# Patient Record
Sex: Male | Born: 1995
Health system: Southern US, Community
[De-identification: ages and names within clinical notes are randomized; demographics above are authoritative.]

## PROBLEM LIST (undated history)

## (undated) DIAGNOSIS — J45909 Unspecified asthma, uncomplicated: Secondary | ICD-10-CM

---
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
## 2017-03-22 ENCOUNTER — Emergency Department (HOSPITAL_COMMUNITY)
Admission: EM | Admit: 2017-03-22 | Discharge: 2017-03-23 | Disposition: A | Discharge status: Home / Self Care | Payer: Self-pay | Attending: Emergency Medicine | Admitting: Emergency Medicine

## 2017-03-22 ENCOUNTER — Encounter (HOSPITAL_COMMUNITY): Payer: Self-pay | Admitting: Emergency Medicine

## 2017-03-22 ENCOUNTER — Emergency Department (HOSPITAL_COMMUNITY): Payer: Self-pay

## 2017-03-22 DIAGNOSIS — Z87891 Personal history of nicotine dependence: Secondary | ICD-10-CM | POA: Insufficient documentation

## 2017-03-22 DIAGNOSIS — J45909 Unspecified asthma, uncomplicated: Secondary | ICD-10-CM | POA: Insufficient documentation

## 2017-03-22 DIAGNOSIS — Y999 Unspecified external cause status: Secondary | ICD-10-CM | POA: Insufficient documentation

## 2017-03-22 DIAGNOSIS — M25562 Pain in left knee: Secondary | ICD-10-CM

## 2017-03-22 DIAGNOSIS — Y9289 Other specified places as the place of occurrence of the external cause: Secondary | ICD-10-CM | POA: Insufficient documentation

## 2017-03-22 DIAGNOSIS — Y939 Activity, unspecified: Secondary | ICD-10-CM | POA: Insufficient documentation

## 2017-03-22 DIAGNOSIS — W501XXA Accidental kick by another person, initial encounter: Secondary | ICD-10-CM | POA: Insufficient documentation

## 2017-03-22 DIAGNOSIS — S8992XA Unspecified injury of left lower leg, initial encounter: Secondary | ICD-10-CM | POA: Insufficient documentation

## 2017-03-22 HISTORY — DX: Unspecified asthma, uncomplicated: J45.909

## 2017-03-22 MED ORDER — NAPROXEN 500 MG PO TABS: 500.0000 mg | ORAL_TABLET | Freq: Two times a day (BID) | ORAL | 0 refills | Status: DC
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
## 2017-03-22 NOTE — Discharge Instructions (Signed)
Take the prescribed medication as directed. Follow-up with Dr. Linna CapriceSwinteck in clinic--call his office on Monday morning to make an appt. Return to the ED for new or worsening symptoms.
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
## 2017-03-23 NOTE — ED Notes (Signed)
Pt understood dc material. NAD noted. Script given at dc  

## 2017-03-25 DIAGNOSIS — M238X2 Other internal derangements of left knee: Secondary | ICD-10-CM | POA: Diagnosis not present

## 2017-03-25 DIAGNOSIS — M25562 Pain in left knee: Secondary | ICD-10-CM | POA: Diagnosis not present

## 2017-03-28 ENCOUNTER — Ambulatory Visit (HOSPITAL_COMMUNITY)
Admission: RE | Admit: 2017-03-28 | Discharge: 2017-03-28 | Disposition: A | Discharge status: Home / Self Care | Payer: 59 | Source: Ambulatory Visit | Attending: Specialist | Admitting: Specialist

## 2017-03-28 ENCOUNTER — Other Ambulatory Visit (HOSPITAL_COMMUNITY): Payer: Self-pay | Admitting: Specialist

## 2017-03-28 DIAGNOSIS — M25562 Pain in left knee: Secondary | ICD-10-CM | POA: Insufficient documentation

## 2017-03-28 DIAGNOSIS — Z135 Encounter for screening for eye and ear disorders: Secondary | ICD-10-CM | POA: Diagnosis not present

## 2017-03-28 DIAGNOSIS — M238X2 Other internal derangements of left knee: Secondary | ICD-10-CM | POA: Diagnosis not present

## 2017-04-02 DIAGNOSIS — S83282A Other tear of lateral meniscus, current injury, left knee, initial encounter: Secondary | ICD-10-CM | POA: Diagnosis not present

## 2017-04-02 DIAGNOSIS — S83512A Sprain of anterior cruciate ligament of left knee, initial encounter: Secondary | ICD-10-CM | POA: Diagnosis not present

## 2017-04-17 DIAGNOSIS — S83512A Sprain of anterior cruciate ligament of left knee, initial encounter: Secondary | ICD-10-CM | POA: Diagnosis not present

## 2017-04-17 DIAGNOSIS — G8918 Other acute postprocedural pain: Secondary | ICD-10-CM | POA: Diagnosis not present

## 2017-04-17 DIAGNOSIS — S83282A Other tear of lateral meniscus, current injury, left knee, initial encounter: Secondary | ICD-10-CM | POA: Diagnosis not present

## 2017-04-21 DIAGNOSIS — S83207D Unspecified tear of unspecified meniscus, current injury, left knee, subsequent encounter: Secondary | ICD-10-CM | POA: Diagnosis not present

## 2017-04-21 DIAGNOSIS — S83512D Sprain of anterior cruciate ligament of left knee, subsequent encounter: Secondary | ICD-10-CM | POA: Diagnosis not present

## 2017-04-21 DIAGNOSIS — S83282D Other tear of lateral meniscus, current injury, left knee, subsequent encounter: Secondary | ICD-10-CM | POA: Diagnosis not present

## 2017-04-29 DIAGNOSIS — S83512D Sprain of anterior cruciate ligament of left knee, subsequent encounter: Secondary | ICD-10-CM | POA: Diagnosis not present

## 2017-04-29 DIAGNOSIS — S83207D Unspecified tear of unspecified meniscus, current injury, left knee, subsequent encounter: Secondary | ICD-10-CM | POA: Diagnosis not present

## 2017-05-01 DIAGNOSIS — S83207D Unspecified tear of unspecified meniscus, current injury, left knee, subsequent encounter: Secondary | ICD-10-CM | POA: Diagnosis not present

## 2017-05-01 DIAGNOSIS — S83512D Sprain of anterior cruciate ligament of left knee, subsequent encounter: Secondary | ICD-10-CM | POA: Diagnosis not present

## 2017-05-06 DIAGNOSIS — S83512D Sprain of anterior cruciate ligament of left knee, subsequent encounter: Secondary | ICD-10-CM | POA: Diagnosis not present

## 2017-05-06 DIAGNOSIS — S83207D Unspecified tear of unspecified meniscus, current injury, left knee, subsequent encounter: Secondary | ICD-10-CM | POA: Diagnosis not present

## 2017-05-08 DIAGNOSIS — S83207D Unspecified tear of unspecified meniscus, current injury, left knee, subsequent encounter: Secondary | ICD-10-CM | POA: Diagnosis not present

## 2017-05-08 DIAGNOSIS — S83512D Sprain of anterior cruciate ligament of left knee, subsequent encounter: Secondary | ICD-10-CM | POA: Diagnosis not present

## 2017-05-16 DIAGNOSIS — S83207D Unspecified tear of unspecified meniscus, current injury, left knee, subsequent encounter: Secondary | ICD-10-CM | POA: Diagnosis not present

## 2017-05-16 DIAGNOSIS — S83512D Sprain of anterior cruciate ligament of left knee, subsequent encounter: Secondary | ICD-10-CM | POA: Diagnosis not present

## 2017-05-23 DIAGNOSIS — S83512D Sprain of anterior cruciate ligament of left knee, subsequent encounter: Secondary | ICD-10-CM | POA: Diagnosis not present

## 2017-05-23 DIAGNOSIS — S83207D Unspecified tear of unspecified meniscus, current injury, left knee, subsequent encounter: Secondary | ICD-10-CM | POA: Diagnosis not present

## 2017-05-30 DIAGNOSIS — S83207D Unspecified tear of unspecified meniscus, current injury, left knee, subsequent encounter: Secondary | ICD-10-CM | POA: Diagnosis not present

## 2017-05-30 DIAGNOSIS — S83512D Sprain of anterior cruciate ligament of left knee, subsequent encounter: Secondary | ICD-10-CM | POA: Diagnosis not present

## 2017-06-05 DIAGNOSIS — S83512D Sprain of anterior cruciate ligament of left knee, subsequent encounter: Secondary | ICD-10-CM | POA: Diagnosis not present

## 2017-06-05 DIAGNOSIS — S83207D Unspecified tear of unspecified meniscus, current injury, left knee, subsequent encounter: Secondary | ICD-10-CM | POA: Diagnosis not present

## 2017-06-13 DIAGNOSIS — S83512D Sprain of anterior cruciate ligament of left knee, subsequent encounter: Secondary | ICD-10-CM | POA: Diagnosis not present

## 2017-06-13 DIAGNOSIS — S83207D Unspecified tear of unspecified meniscus, current injury, left knee, subsequent encounter: Secondary | ICD-10-CM | POA: Diagnosis not present

## 2017-06-20 DIAGNOSIS — S83512D Sprain of anterior cruciate ligament of left knee, subsequent encounter: Secondary | ICD-10-CM | POA: Diagnosis not present

## 2017-06-20 DIAGNOSIS — S83207D Unspecified tear of unspecified meniscus, current injury, left knee, subsequent encounter: Secondary | ICD-10-CM | POA: Diagnosis not present

## 2017-06-27 DIAGNOSIS — S83512D Sprain of anterior cruciate ligament of left knee, subsequent encounter: Secondary | ICD-10-CM | POA: Diagnosis not present

## 2017-06-27 DIAGNOSIS — S83207D Unspecified tear of unspecified meniscus, current injury, left knee, subsequent encounter: Secondary | ICD-10-CM | POA: Diagnosis not present

## 2017-07-11 DIAGNOSIS — S83512D Sprain of anterior cruciate ligament of left knee, subsequent encounter: Secondary | ICD-10-CM | POA: Diagnosis not present

## 2017-07-11 DIAGNOSIS — S83207D Unspecified tear of unspecified meniscus, current injury, left knee, subsequent encounter: Secondary | ICD-10-CM | POA: Diagnosis not present

## 2017-08-05 DIAGNOSIS — M545 Low back pain: Secondary | ICD-10-CM | POA: Diagnosis not present

## 2018-01-27 DIAGNOSIS — Z720 Tobacco use: Secondary | ICD-10-CM | POA: Diagnosis not present

## 2018-05-06 DIAGNOSIS — J020 Streptococcal pharyngitis: Secondary | ICD-10-CM | POA: Diagnosis not present

## 2018-05-06 DIAGNOSIS — R509 Fever, unspecified: Secondary | ICD-10-CM | POA: Diagnosis not present

## 2018-05-06 DIAGNOSIS — R0981 Nasal congestion: Secondary | ICD-10-CM | POA: Diagnosis not present

## 2018-10-06 DIAGNOSIS — H6090 Unspecified otitis externa, unspecified ear: Secondary | ICD-10-CM | POA: Diagnosis not present

## 2018-10-14 DIAGNOSIS — H6090 Unspecified otitis externa, unspecified ear: Secondary | ICD-10-CM | POA: Diagnosis not present

## 2018-10-14 DIAGNOSIS — J069 Acute upper respiratory infection, unspecified: Secondary | ICD-10-CM | POA: Diagnosis not present

## 2018-10-14 DIAGNOSIS — S70262A Insect bite (nonvenomous), left hip, initial encounter: Secondary | ICD-10-CM | POA: Diagnosis not present

## 2018-12-25 DIAGNOSIS — Z720 Tobacco use: Secondary | ICD-10-CM | POA: Diagnosis not present

## 2018-12-25 DIAGNOSIS — M545 Low back pain: Secondary | ICD-10-CM | POA: Diagnosis not present

## 2018-12-27 ENCOUNTER — Other Ambulatory Visit: Payer: Self-pay

## 2018-12-27 ENCOUNTER — Emergency Department (HOSPITAL_COMMUNITY): Payer: Managed Care, Other (non HMO)

## 2018-12-27 ENCOUNTER — Ambulatory Visit (HOSPITAL_COMMUNITY)
Admission: EM | Admit: 2018-12-27 | Discharge: 2018-12-27 | Discharge status: Against Medical Advice | Payer: Managed Care, Other (non HMO) | Source: Home / Self Care

## 2018-12-27 ENCOUNTER — Emergency Department (HOSPITAL_COMMUNITY)
Admission: EM | Admit: 2018-12-27 | Discharge: 2018-12-27 | Disposition: A | Discharge status: Home / Self Care | Payer: Managed Care, Other (non HMO) | Attending: Emergency Medicine | Admitting: Emergency Medicine

## 2018-12-27 ENCOUNTER — Encounter (HOSPITAL_COMMUNITY): Payer: Self-pay | Admitting: Emergency Medicine

## 2018-12-27 DIAGNOSIS — M545 Low back pain, unspecified: Secondary | ICD-10-CM

## 2018-12-27 DIAGNOSIS — R110 Nausea: Secondary | ICD-10-CM | POA: Insufficient documentation

## 2018-12-27 DIAGNOSIS — F1490 Cocaine use, unspecified, uncomplicated: Secondary | ICD-10-CM | POA: Diagnosis not present

## 2018-12-27 DIAGNOSIS — Z87891 Personal history of nicotine dependence: Secondary | ICD-10-CM | POA: Diagnosis not present

## 2018-12-27 DIAGNOSIS — R1031 Right lower quadrant pain: Secondary | ICD-10-CM | POA: Diagnosis present

## 2018-12-27 LAB — CBC WITH DIFFERENTIAL/PLATELET
Abs Immature Granulocytes: 0.03 10*3/uL (ref 0.00–0.07)
BASOS ABS: 0 10*3/uL (ref 0.0–0.1)
Basophils Relative: 0 %
EOS PCT: 3 %
Eosinophils Absolute: 0.3 10*3/uL (ref 0.0–0.5)
HEMATOCRIT: 54.6 % — AB (ref 39.0–52.0)
HEMOGLOBIN: 18.8 g/dL — AB (ref 13.0–17.0)
IMMATURE GRANULOCYTES: 0 %
LYMPHS ABS: 3.2 10*3/uL (ref 0.7–4.0)
LYMPHS PCT: 30 %
MCH: 29.6 pg (ref 26.0–34.0)
MCHC: 34.4 g/dL (ref 30.0–36.0)
MCV: 85.8 fL (ref 80.0–100.0)
Monocytes Absolute: 1 10*3/uL (ref 0.1–1.0)
Monocytes Relative: 10 %
NEUTROS ABS: 6.1 10*3/uL (ref 1.7–7.7)
NEUTROS PCT: 57 %
NRBC: 0 % (ref 0.0–0.2)
Platelets: 254 10*3/uL (ref 150–400)
RBC: 6.36 MIL/uL — AB (ref 4.22–5.81)
RDW: 12.4 % (ref 11.5–15.5)
WBC: 10.7 10*3/uL — AB (ref 4.0–10.5)

## 2018-12-27 LAB — URINALYSIS, ROUTINE W REFLEX MICROSCOPIC
BILIRUBIN URINE: NEGATIVE
Glucose, UA: NEGATIVE mg/dL
Hgb urine dipstick: NEGATIVE
KETONES UR: NEGATIVE mg/dL
LEUKOCYTE UA: NEGATIVE
NITRITE: NEGATIVE
PH: 5 (ref 5.0–8.0)
Protein, ur: NEGATIVE mg/dL
SPECIFIC GRAVITY, URINE: 1.02 (ref 1.005–1.030)

## 2018-12-27 LAB — BASIC METABOLIC PANEL
ANION GAP: 9 (ref 5–15)
BUN: 11 mg/dL (ref 6–20)
CHLORIDE: 106 mmol/L (ref 98–111)
CO2: 22 mmol/L (ref 22–32)
Calcium: 9.6 mg/dL (ref 8.9–10.3)
Creatinine, Ser: 0.82 mg/dL (ref 0.61–1.24)
GFR calc Af Amer: >60 mL/min (ref >60)
Glucose, Bld: 78 mg/dL (ref 70–99)
POTASSIUM: 4.1 mmol/L (ref 3.5–5.1)
SODIUM: 137 mmol/L (ref 135–145)

## 2018-12-27 MED ORDER — MORPHINE SULFATE (PF) 4 MG/ML IV SOLN
4.0000 mg | Freq: Once | INTRAVENOUS | Status: AC
  Administered 2018-12-27: 4 mg via INTRAVENOUS
  Filled 2018-12-27: qty 1

## 2018-12-27 MED ORDER — CYCLOBENZAPRINE HCL 10 MG PO TABS: 10.0000 mg | ORAL_TABLET | Freq: Two times a day (BID) | ORAL | 0 refills | Status: DC | PRN

## 2018-12-27 MED ORDER — IBUPROFEN 600 MG PO TABS: 600.0000 mg | ORAL_TABLET | Freq: Four times a day (QID) | ORAL | 0 refills | Status: DC | PRN

## 2018-12-27 MED ORDER — SODIUM CHLORIDE 0.9 % IV BOLUS: 1000.0000 mL | Freq: Once | INTRAVENOUS | Status: DC

## 2018-12-27 MED ORDER — IOHEXOL 300 MG/ML  SOLN
100.0000 mL | Freq: Once | INTRAMUSCULAR | Status: AC | PRN
  Administered 2018-12-27: 100 mL via INTRAVENOUS

## 2018-12-27 MED ORDER — SODIUM CHLORIDE 0.9 % IV BOLUS
1000.0000 mL | Freq: Once | INTRAVENOUS | Status: AC
  Administered 2018-12-27: 1000 mL via INTRAVENOUS

## 2018-12-27 MED ORDER — KETOROLAC TROMETHAMINE 30 MG/ML IJ SOLN
30.0000 mg | Freq: Once | INTRAMUSCULAR | Status: AC
  Administered 2018-12-27: 30 mg via INTRAVENOUS
  Filled 2018-12-27: qty 1

## 2018-12-27 NOTE — ED Provider Notes (Signed)
[facility]   CSN: [id number] Arrival date & time: 12/27/18  1332     History   Chief Complaint Chief Complaint  Patient presents with  . Flank Pain    HPI Stephen Rodriguez is a 23 y.o. male.  Patient is a 23 year old male who presents with back pain.  He states he was walking in about an hour ago had sudden onset of pain in his right lower back.  He says it feels tight down his lower legs as well but no specific radiation.  No associated abdominal pain.  No testicular or inguinal pain.  He has some nausea but no vomiting.  No urinary symptoms.  Other than he has had some mild hesitation with urination recently.  He has had some history of minor back problems in the past but not this significant.  No history of kidney stones.  No known fevers.  No numbness or weakness to his legs.  Pain is worse with any movements.     Past Medical History:  Diagnosis Date  . Asthma     There are no active problems to display for this patient.   No past surgical history on file.      Home Medications    Prior to Admission medications   Medication Sig Start Date End Date Taking? Authorizing Provider  cyclobenzaprine (FLEXERIL) 10 MG tablet Take 1 tablet (10 mg total) by mouth 2 (two) times daily as needed for muscle spasms. 12/27/18   Rolan BuccoBelfi, Ariz Terrones, MD  ibuprofen (ADVIL,MOTRIN) 600 MG tablet Take 1 tablet (600 mg total) by mouth every 6 (six) hours as needed. 12/27/18   Rolan BuccoBelfi, Linah Klapper, MD  naproxen (NAPROSYN) 500 MG tablet Take 1 tablet (500 mg total) by mouth 2 (two) times daily with a meal. 03/22/17   Garlon HatchetSanders, Lisa M, PA-C    Family History No family history on file.  Social History Social History   Tobacco Use  . Smoking status: Former Smoker    Types: Cigarettes    Last attempt to quit: 02/10/2017    Years since quitting: 1.8  . Smokeless tobacco: Never Used  Substance Use Topics  . Alcohol use: Yes    Alcohol/week: 1.0 - 8.0  standard drinks    Types: 1 - 8 Cans of beer per week    Comment: socially  . Drug use: Yes    Types: Cocaine    Comment: last use 12/26/18     Allergies   Patient has no known allergies.   Review of Systems Review of Systems  Constitutional: Negative for chills, diaphoresis, fatigue and fever.  HENT: Negative for congestion, rhinorrhea and sneezing.   Eyes: Negative.   Respiratory: Negative for cough, chest tightness and shortness of breath.   Cardiovascular: Negative for chest pain and leg swelling.  Gastrointestinal: Positive for nausea. Negative for abdominal pain, blood in stool, diarrhea and vomiting.  Genitourinary: Negative for difficulty urinating, flank pain, frequency and hematuria.  Musculoskeletal: Positive for back pain. Negative for arthralgias.  Skin: Negative for rash.  Neurological: Negative for dizziness, speech difficulty, weakness, numbness and headaches.     Physical Exam Updated Vital Signs BP 117/87   Pulse 80   Temp 97.7 F (36.5 C) (Oral)   Resp 14   SpO2 100%   Physical Exam Constitutional:      Appearance: He is well-developed.  HENT:     Head: Normocephalic and atraumatic.  Eyes:     Pupils: Pupils are  equal, round, and reactive to light.  Neck:     Musculoskeletal: Normal range of motion and neck supple.  Cardiovascular:     Rate and Rhythm: Normal rate and regular rhythm.     Heart sounds: Normal heart sounds.  Pulmonary:     Effort: Pulmonary effort is normal. No respiratory distress.     Breath sounds: Normal breath sounds. No wheezing or rales.  Chest:     Chest wall: No tenderness.  Abdominal:     General: Bowel sounds are normal.     Palpations: Abdomen is soft.     Tenderness: There is no abdominal tenderness. There is no guarding or rebound.  Musculoskeletal: Normal range of motion.     Comments: Positive tenderness on palpation musculature in the right lower back.  There is no spinal tenderness.  There is a positive  straight leg raise on the right.  He has symmetric patellar reflexes.  He has normal sensation to light touch in the lower extremities bilaterally.  He is able to flex and extend the toes without difficulty.  Lymphadenopathy:     Cervical: No cervical adenopathy.  Skin:    General: Skin is warm and dry.     Findings: No rash.  Neurological:     Mental Status: He is alert and oriented to person, place, and time.      ED Treatments / Results  Labs (all labs ordered are listed, but only abnormal results are displayed) Labs Reviewed  CBC WITH DIFFERENTIAL/PLATELET - Abnormal; Notable for the following components:      Result Value   WBC 10.7 (*)    RBC 6.36 (*)    Hemoglobin 18.8 (*)    HCT 54.6 (*)    All other components within normal limits  URINALYSIS, ROUTINE W REFLEX MICROSCOPIC - Abnormal; Notable for the following components:   APPearance HAZY (*)    All other components within normal limits  BASIC METABOLIC PANEL    EKG None  Radiology Ct Abdomen Pelvis W Contrast  Result Date: 12/27/2018 CLINICAL DATA:  Intermittent right flank pain with nausea. EXAM: CT ABDOMEN AND PELVIS WITH CONTRAST TECHNIQUE: Multidetector CT imaging of the abdomen and pelvis was performed using the standard protocol following bolus administration of intravenous contrast. CONTRAST:  OMNIPAQUE IOHEXOL 300 MG/ML  SOLN COMPARISON:  None. FINDINGS: Lower chest: Clear lung bases. No significant pleural or pericardial effusion. Hepatobiliary: The liver is normal in density without suspicious focal abnormality. No evidence of gallstones, gallbladder wall thickening or biliary dilatation. Pancreas: Unremarkable. No pancreatic ductal dilatation or surrounding inflammatory changes. Spleen: Normal in size without focal abnormality. Adrenals/Urinary Tract: Both adrenal glands appear normal. The kidneys, ureters and bladder appear normal. Small renal calculi could be obscured by intravenous contrast. There is  no evidence of ureteral calculus or hydronephrosis. Stomach/Bowel: No evidence of bowel wall thickening, distention or surrounding inflammatory change. The appendix appears normal. Vascular/Lymphatic: There are no enlarged abdominal or pelvic lymph nodes. No acute vascular findings. Incidental retroaortic left renal vein. Reproductive: The prostate gland and seminal vesicles appear normal. Other: No evidence of abdominal wall mass or hernia. No ascites. Musculoskeletal: No acute or significant osseous findings. 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 IMPRESSION: No acute findings or explanation for the patient's symptoms. Electronically Signed   By: Carey Bullocks M.D.   On: 12/27/2018 16:33    Procedures Procedures (including critical care time)  Medications Ordered in ED Medications  sodium  chloride 0.9 % bolus 1,000 mL (0 mLs Intravenous Hold 12/27/18 1437)  morphine 4 MG/ML injection 4 mg (4 mg Intravenous Given 12/27/18 1656)  ketorolac (TORADOL) 30 MG/ML injection 30 mg (30 mg Intravenous Given 12/27/18 1411)  sodium chloride 0.9 % bolus 1,000 mL (0 mLs Intravenous Stopped 12/27/18 1453)  iohexol (OMNIPAQUE) 300 MG/ML solution 100 mL (100 mLs Intravenous Contrast Given 12/27/18 1546)     Initial Impression / Assessment and Plan / ED Course  I have reviewed the triage vital signs and the nursing notes.  Pertinent labs & imaging results that were available during my care of the patient were reviewed by me and considered in my medical decision making (see chart for details).    Patient is a 23 year old male who presents with pain in his right back.  It was pretty severe on arrival but seem to be more in the musculoskeletal area.  There is no spinal tenderness.  No neurologic deficits.  However he was markedly tachycardic and therefore I ordered some labs and a urine.  While we are waiting for these, he had a near syncopal type event and  got extremely pale and diaphoretic.  He became less responsive for short period time.  He was given a bolus of IV fluids.  CT scan was performed of his abdomen pelvis which shows no acute abnormality.  He felt much better after observation IV fluids.  He is currently back to baseline.  He does not admit to using cocaine earlier which may explain his elevated heart rate into the 140s.  His heart rate has subsequently normalized.  His blood pressure is stable.  His pain is well controlled.  He was discharged home in good condition.  He was given a prescription for ibuprofen and Flexeril.  He was encouraged to follow-up with his PCP.  Return precautions were given.  Final Clinical Impressions(s) / ED Diagnoses   Final diagnoses:  Acute right-sided low back pain without sciatica    ED Discharge Orders         Ordered    ibuprofen (ADVIL,MOTRIN) 600 MG tablet  Every 6 hours PRN     12/27/18 1728    cyclobenzaprine (FLEXERIL) 10 MG tablet  2 times daily PRN     12/27/18 1728           Rolan Bucco, MD 12/27/18 1735

## 2018-12-27 NOTE — ED Triage Notes (Signed)
Pt arrives pov with right flank pain that feels "like it comes in waves" just started suddenly while walking in 1 hour.  Denies any v/d. Pt feels nausea associated with pain. Pt denies any pain with urination but has noticed "hesitation" with urination. Pt noted to be tachycardic in triage heart rate: 140

## 2018-12-27 NOTE — ED Notes (Signed)
[facility] ED called - pt is in ED wishing to be seen there.

## 2018-12-27 NOTE — ED Notes (Signed)
Patient verbalizes understanding of discharge instructions. Opportunity for questioning and answers were provided. Armband removed by staff, pt discharged from ED.  

## 2018-12-27 NOTE — ED Notes (Signed)
Patient transported to CT 

## 2018-12-28 DIAGNOSIS — M545 Low back pain: Secondary | ICD-10-CM | POA: Diagnosis not present

## 2018-12-28 DIAGNOSIS — Z01818 Encounter for other preprocedural examination: Secondary | ICD-10-CM | POA: Diagnosis not present

## 2018-12-28 DIAGNOSIS — M5126 Other intervertebral disc displacement, lumbar region: Secondary | ICD-10-CM | POA: Diagnosis not present

## 2018-12-30 DIAGNOSIS — M545 Low back pain: Secondary | ICD-10-CM | POA: Diagnosis not present

## 2019-01-01 DIAGNOSIS — M5136 Other intervertebral disc degeneration, lumbar region: Secondary | ICD-10-CM | POA: Diagnosis not present

## 2019-01-01 DIAGNOSIS — M545 Low back pain: Secondary | ICD-10-CM | POA: Diagnosis not present

## 2019-01-11 DIAGNOSIS — M5416 Radiculopathy, lumbar region: Secondary | ICD-10-CM | POA: Diagnosis not present

## 2019-01-18 DIAGNOSIS — M545 Low back pain: Secondary | ICD-10-CM | POA: Diagnosis not present

## 2019-01-18 DIAGNOSIS — M5136 Other intervertebral disc degeneration, lumbar region: Secondary | ICD-10-CM | POA: Diagnosis not present

## 2019-01-20 DIAGNOSIS — M5416 Radiculopathy, lumbar region: Secondary | ICD-10-CM | POA: Diagnosis not present

## 2019-11-25 IMAGING — CT CT ABD-PELV W/ CM
2 of 4 series · 16 of 46 positions shown, 18 images · IV contrast (Omni 300)
Comparison: None.

CLINICAL DATA: Intermittent right flank pain with nausea.

EXAM:
CT ABDOMEN AND PELVIS WITH CONTRAST
TECHNIQUE: Multidetector CT imaging of the abdomen and pelvis was performed
using the standard protocol following bolus administration of
intravenous contrast.
CONTRAST:  100mL OMNIPAQUE IOHEXOL 300 MG/ML  SOLN

[Series 3: a/p w/ 5mm · axial · 0.72mm/px · z∈[+903,+1318]mm · 13 of 91 slices shown, 15 images]
[im 4/91  soft-tissue]
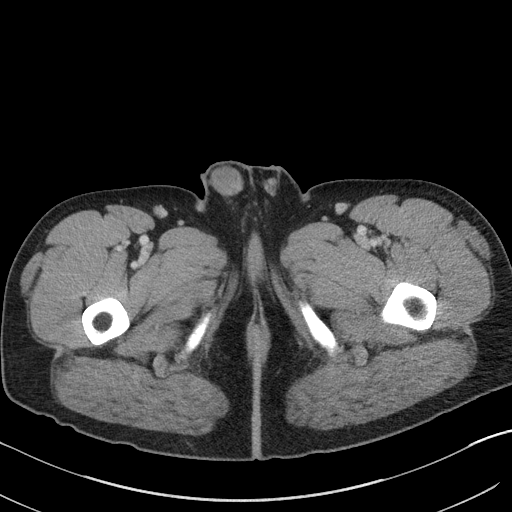
[im 4/91  bone]
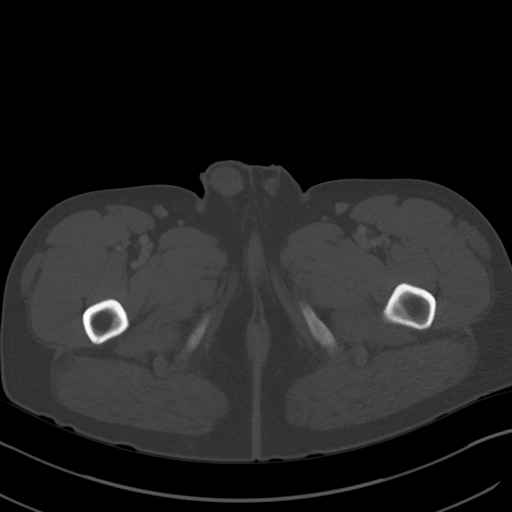
[im 11/91  soft-tissue]
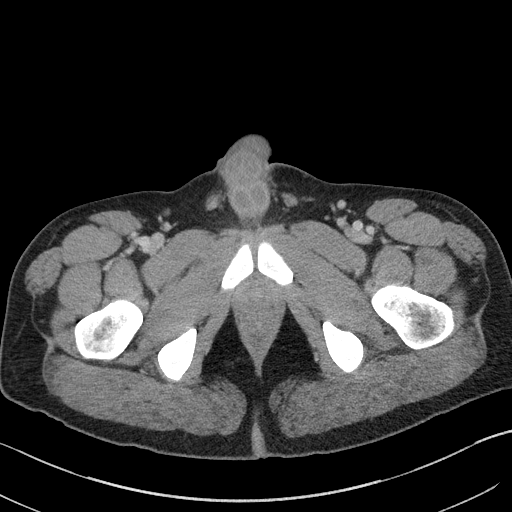
[im 19/91  soft-tissue]
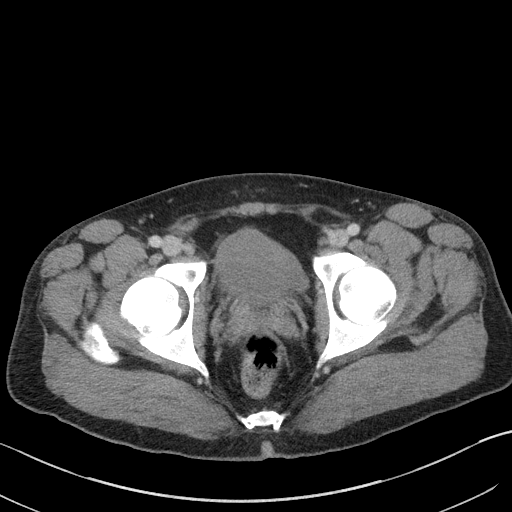
[im 26/91  soft-tissue]
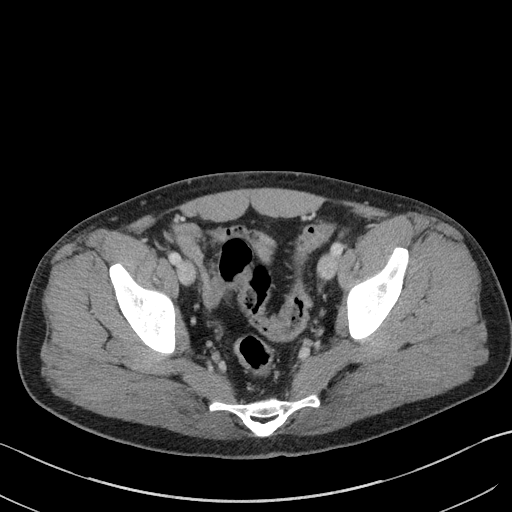
[im 33/91  soft-tissue]
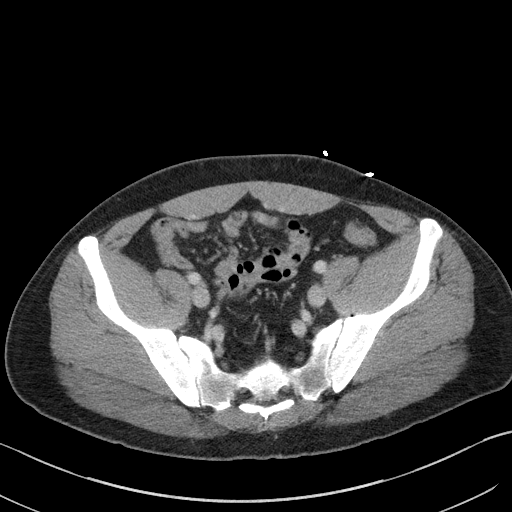
[im 40/91  soft-tissue]
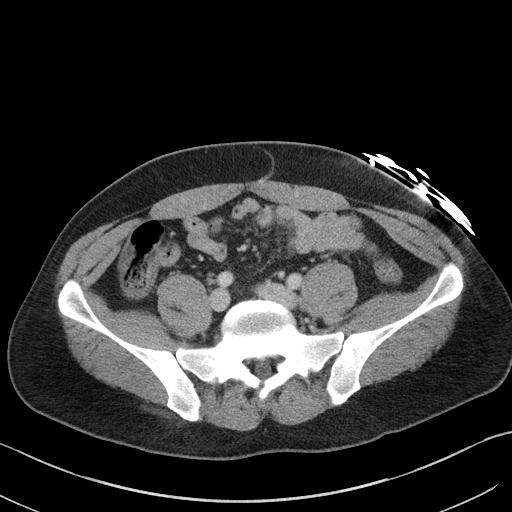
[im 47/91  soft-tissue]
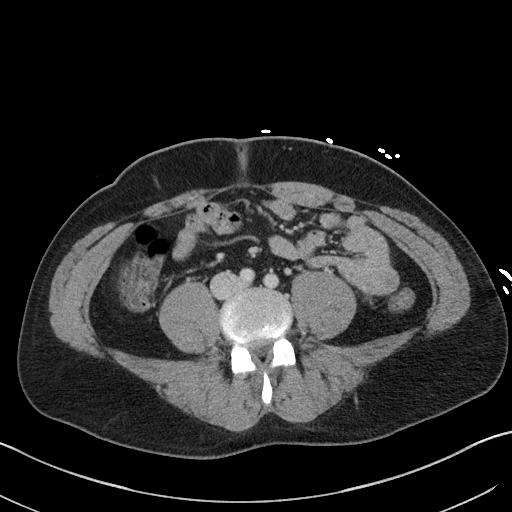
[im 51/91  soft-tissue]
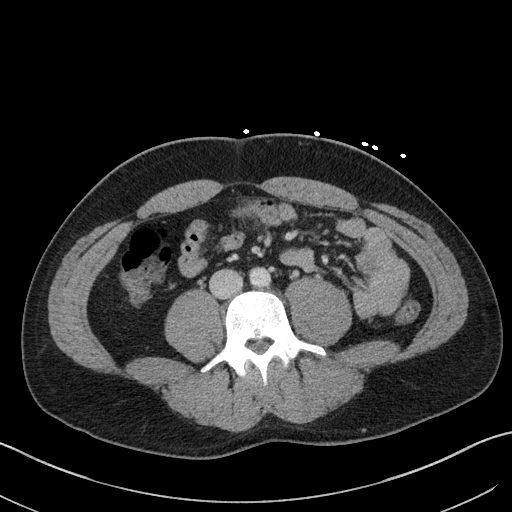
[im 58/91  soft-tissue]
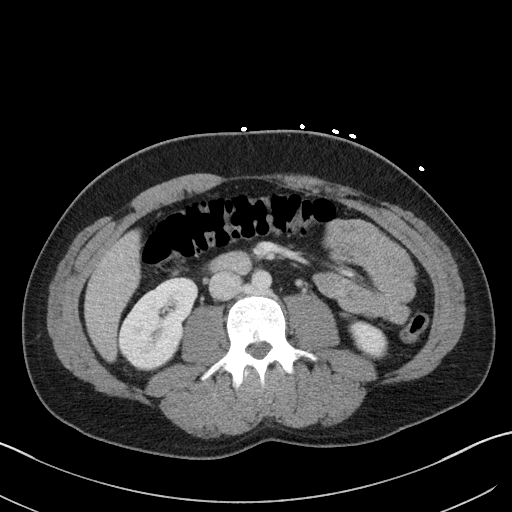
[im 58/91  bone]
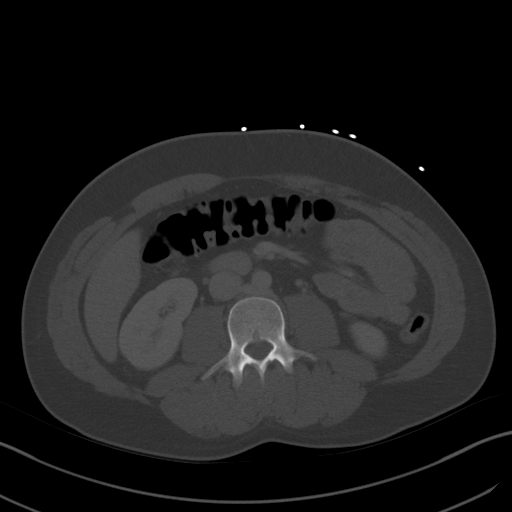
[im 65/91  soft-tissue]
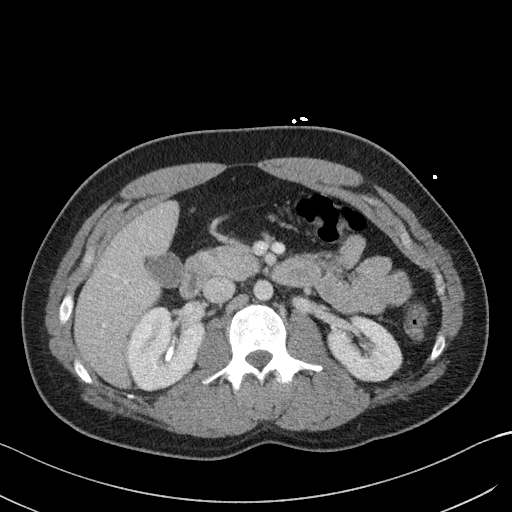
[im 73/91  soft-tissue]
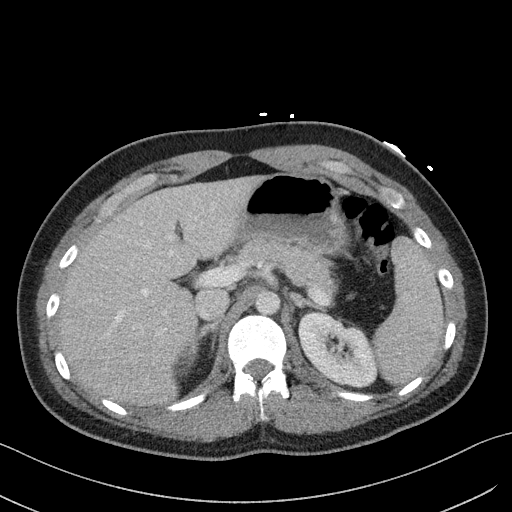
[im 80/91  soft-tissue]
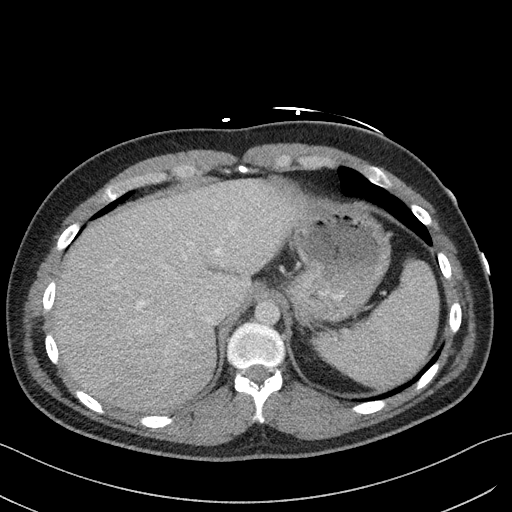
[im 87/91  soft-tissue]
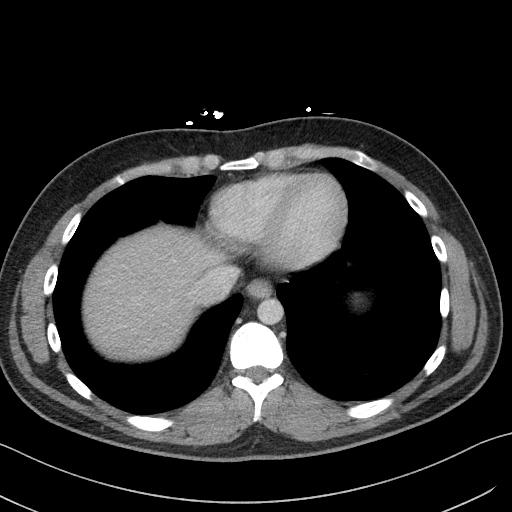

[Series 6: a/p w/ cor · coronal · 0.67mm/px · 3 of 140 slices shown]
[im 47/140  soft-tissue]
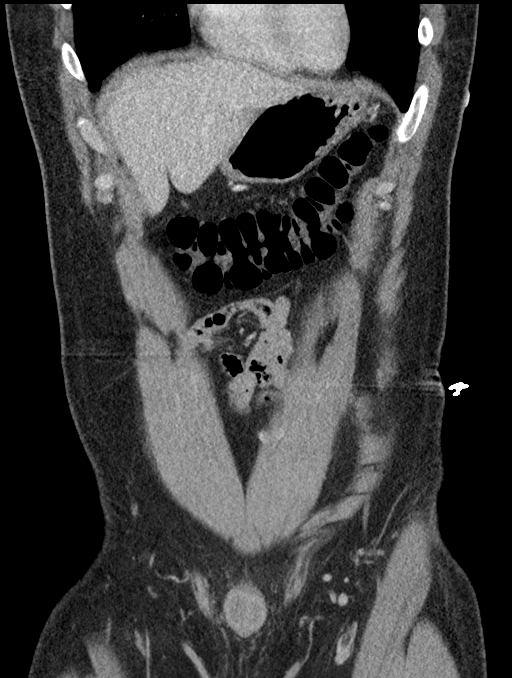
[im 62/140  soft-tissue]
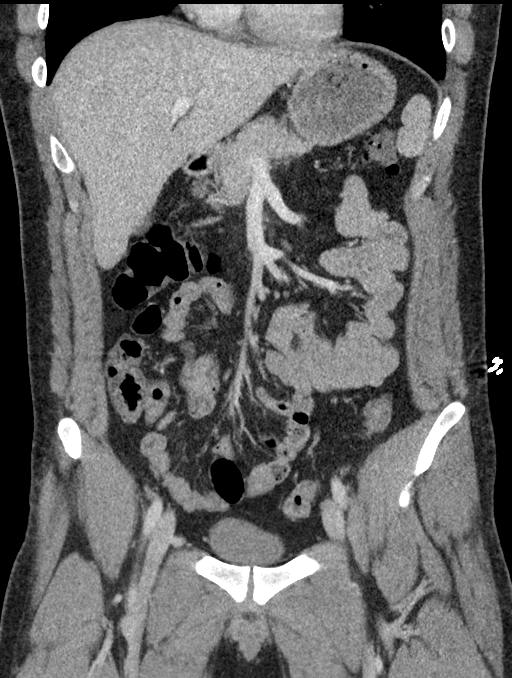
[im 78/140  soft-tissue]
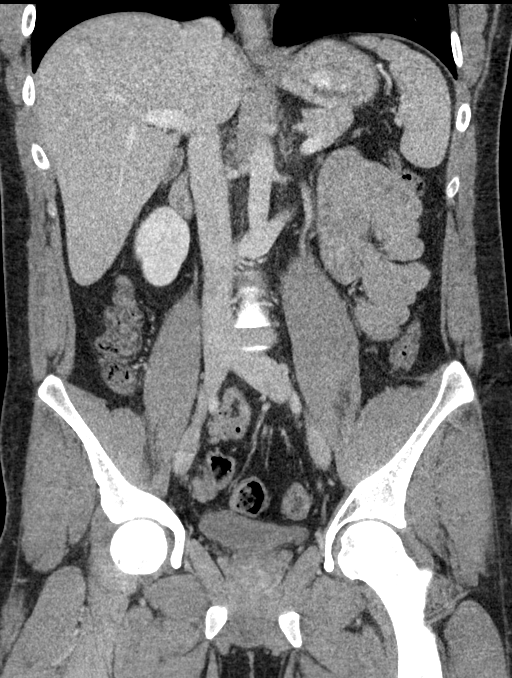

[16 of 46 positions shown; findings below may reference images not displayed]

FINDINGS: Lower chest: Clear lung bases. No significant pleural or pericardial
effusion.

Hepatobiliary: The liver is normal in density without suspicious
focal abnormality. No evidence of gallstones, gallbladder wall
thickening or biliary dilatation.

Pancreas: Unremarkable. No pancreatic ductal dilatation or
surrounding inflammatory changes.

Spleen: Normal in size without focal abnormality.

Adrenals/Urinary Tract: Both adrenal glands appear normal. The
kidneys, ureters and bladder appear normal. Small renal calculi
could be obscured by intravenous contrast. There is no evidence of
ureteral calculus or hydronephrosis.

Stomach/Bowel: No evidence of bowel wall thickening, distention or
surrounding inflammatory change. The appendix appears normal.

Vascular/Lymphatic: There are no enlarged abdominal or pelvic lymph
nodes. No acute vascular findings. Incidental retroaortic left renal
vein.

Reproductive: The prostate gland and seminal vesicles appear normal.

Other: No evidence of abdominal wall mass or hernia. No ascites.

Musculoskeletal: No acute or significant osseous findings.
Developmental fragmentation of the left L5 inferior articulating
facet without typical pars defect.
IMPRESSION: No acute findings or explanation for the patient's symptoms.

## 2019-12-23 ENCOUNTER — Other Ambulatory Visit: Payer: Self-pay

## 2019-12-23 ENCOUNTER — Encounter (HOSPITAL_COMMUNITY): Payer: Self-pay

## 2019-12-23 ENCOUNTER — Ambulatory Visit (HOSPITAL_COMMUNITY)
Admission: EM | Admit: 2019-12-23 | Discharge: 2019-12-23 | Disposition: A | Discharge status: Home / Self Care | Payer: Worker's Compensation | Attending: Emergency Medicine | Admitting: Emergency Medicine

## 2019-12-23 DIAGNOSIS — S060X0A Concussion without loss of consciousness, initial encounter: Secondary | ICD-10-CM

## 2019-12-23 DIAGNOSIS — S0990XA Unspecified injury of head, initial encounter: Secondary | ICD-10-CM | POA: Diagnosis not present

## 2019-12-23 MED ORDER — ONDANSETRON 4 MG PO TBDP: 4.0000 mg | ORAL_TABLET | Freq: Three times a day (TID) | ORAL | 0 refills | Status: AC | PRN

## 2019-12-23 MED ORDER — NAPROXEN 500 MG PO TABS: 500.0000 mg | ORAL_TABLET | Freq: Two times a day (BID) | ORAL | 0 refills | Status: AC

## 2019-12-23 NOTE — Discharge Instructions (Signed)
Your symptoms are concerning for concussion, but if you develop worsening headache, vision changes, difficulty speaking, weakness please follow-up in the emergency room for further imaging to rule out underlying skull fracture/bleeding  Please use Naprosyn twice daily to help with headache Zofran as needed for nausea/vomiting Apply ice to head Please rest, avoid screens Read attached  Follow-up in emergency room if symptoms worsening

## 2019-12-23 NOTE — ED Provider Notes (Addendum)
[facility]    CSN: [id number] Arrival date & time: 12/23/19  1646      History   Chief Complaint Chief Complaint  Patient presents with  . Head Injury    HPI Stephen Rodriguez is a 24 y.o. male history of asthma presenting today for evaluation of head injury.  Patient was at work earlier today and aluminum pipe fell off of the shelf and hit him on the left side of his head.  Accident happened around 230, approximately 2 hours ago.  It did not come to the ground, but denies hitting head on ground or loss of consciousness.  He has had pain and swelling around the area he was hit.  Reports that overall the pipe was relatively light, no more than 4 pounds, but states that it was long and hit him awkwardly on the edge.  He denies any vision changes.  Denies photophobia.  Has had pain in his temporal area with moving his jaw.  Denies any trauma or blow to his jaw.  Has had some mild nausea, denies vomiting.  HPI  Past Medical History:  Diagnosis Date  . Asthma     There are no problems to display for this patient.   History reviewed. No pertinent surgical history.     Home Medications    Prior to Admission medications   Medication Sig Start Date End Date Taking? Authorizing Provider  naproxen (NAPROSYN) 500 MG tablet Take 1 tablet (500 mg total) by mouth 2 (two) times daily. 12/23/19   Rashena Dowling C, PA-C  ondansetron (ZOFRAN ODT) 4 MG disintegrating tablet Take 1 tablet (4 mg total) by mouth every 8 (eight) hours as needed for nausea or vomiting. 12/23/19   Whyatt Klinger, Junius Creamer, PA-C    Family History Family History  Family history unknown: Yes    Social History Social History   Tobacco Use  . Smoking status: Former Smoker    Types: Cigarettes    Quit date: 02/10/2017    Years since quitting: 2.8  . Smokeless tobacco: Never Used  Substance Use Topics  . Alcohol use: Yes    Alcohol/week: 1.0 - 8.0 standard drinks    Types: 1 - 8 Cans of beer per week   Comment: socially  . Drug use: Yes    Types: Cocaine    Comment: last use 12/26/18     Allergies   Patient has no known allergies.   Review of Systems Review of Systems  Constitutional: Negative for fatigue and fever.  HENT: Negative for congestion, sinus pressure and sore throat.   Eyes: Negative for photophobia, pain and visual disturbance.  Respiratory: Negative for cough and shortness of breath.   Cardiovascular: Negative for chest pain.  Gastrointestinal: Negative for abdominal pain, nausea and vomiting.  Genitourinary: Negative for decreased urine volume and hematuria.  Musculoskeletal: Negative for myalgias, neck pain and neck stiffness.  Neurological: Positive for dizziness, light-headedness and headaches. Negative for syncope, facial asymmetry, speech difficulty, weakness and numbness.     Physical Exam Triage Vital Signs ED Triage Vitals  Enc Vitals Group     BP 12/23/19 1704 (!) 161/109     Pulse Rate 12/23/19 1704 80     Resp 12/23/19 1704 18     Temp 12/23/19 1704 98.1 F (36.7 C)     Temp Source 12/23/19 1704 Oral     SpO2 12/23/19 1704 99 %     Weight --      Height --  Head Circumference --      Peak Flow --      Pain Score 12/23/19 1709 5     Pain Loc --      Pain Edu? --      Excl. in [place]? --    No data found.  Updated Vital Signs BP (!) 161/109 (BP Location: Left Arm)   Pulse 80   Temp 98.1 F (36.7 C) (Oral)   Resp 18   SpO2 99%   Visual Acuity Right Eye Distance:   Left Eye Distance:   Bilateral Distance:    Right Eye Near:   Left Eye Near:    Bilateral Near:     Physical Exam Vitals and nursing note reviewed.  Constitutional:      Appearance: Normal appearance. He is well-developed.  HENT:     Head: Normocephalic and atraumatic.     Comments: Left parietal area with superficial swelling with superficial erythematous abrasion noted to supra auricular area.  Tenderness to palpation in this area, no underlying crepitus  appreciated  Nontender to palpation around bilateral orbits or along bilateral TMJ  Left temporal tenderness, no nodularity palpated compared to right    Ears:     Comments: No hemotympanum bilaterally    Mouth/Throat:     Comments: There is limited range of motion with opening jaw Palate elevates symmetrically, no soft palate swelling, posterior pharynx patent, uvula midline Eyes:     Extraocular Movements: Extraocular movements intact.     Conjunctiva/sclera: Conjunctivae normal.     Pupils: Pupils are equal, round, and reactive to light.  Cardiovascular:     Rate and Rhythm: Normal rate and regular rhythm.     Heart sounds: No murmur.  Pulmonary:     Effort: Pulmonary effort is normal. No respiratory distress.     Breath sounds: Normal breath sounds.     Comments: Breathing comfortably at rest, CTABL, no wheezing, rales or other adventitious sounds auscultated Abdominal:     Palpations: Abdomen is soft.     Tenderness: There is no abdominal tenderness.  Musculoskeletal:     Cervical back: Neck supple.  Skin:    General: Skin is warm and dry.  Neurological:     General: No focal deficit present.     Mental Status: He is alert and oriented to person, place, and time.     Comments: Patient A&O x3, cranial nerves II-XII grossly intact, does have pain when attempting eye closure, but able to perform, strength at shoulders, hips and knees 5/5, equal bilaterally, patellar reflex 2+ bilaterally. Normal Finger to nose, RAM and heel to shin. Negative Romberg and Pronator Drift. Gait without abnormality.      UC Treatments / Results  Labs (all labs ordered are listed, but only abnormal results are displayed) Labs Reviewed - No data to display  EKG   Radiology No results found.  Procedures Procedures (including critical care time)  Medications Ordered in UC Medications - No data to display  Initial Impression / Assessment and Plan / UC Course  I have reviewed the triage  vital signs and the nursing notes.  Pertinent labs & imaging results that were available during my care of the patient were reviewed by me and considered in my medical decision making (see chart for details).     No obvious neuro deficits, hit with relatively light pipe according to patient. Discussed unable to rule out underlying fracture/bleed, but based off exam feel this is less likely; Exam suggestive of  likely scalp hematoma with concussive symptoms.  Does have concussion symptoms, recommended close monitoring and low threshold for following up in emergency room for CT scanning for any worsening of symptoms in the next 24 to 48 hours.  Recommending mental rest.  Treating headache with Naprosyn, Zofran as needed for nausea.  Discussed with patient concern over limited range of motion of jaw, he declines concern for jaw/mandible fracture, feels this triggers worsening pain in his left temple/scalp area where he was hit and feels he would be able to fully open jaw if needed.  Recommended following up with sports medicine if concussion symptoms persisting into next week.  Discussed strict return precautions. Patient verbalized understanding and is agreeable with plan.  Final Clinical Impressions(s) / UC Diagnoses   Final diagnoses:  Injury of head, initial encounter  Concussion without loss of consciousness, initial encounter     Discharge Instructions     Your symptoms are concerning for concussion, but if you develop worsening headache, vision changes, difficulty speaking, weakness please follow-up in the emergency room for further imaging to rule out underlying skull fracture/bleeding  Please use Naprosyn twice daily to help with headache Zofran as needed for nausea/vomiting Apply ice to head Please rest, avoid screens Read attached  Follow-up in emergency room if symptoms worsening   ED Prescriptions    Medication Sig Dispense Auth. Provider   naproxen (NAPROSYN) 500 MG tablet  Take 1 tablet (500 mg total) by mouth 2 (two) times daily. 30 tablet Arneisha Kincannon C, PA-C   ondansetron (ZOFRAN ODT) 4 MG disintegrating tablet Take 1 tablet (4 mg total) by mouth every 8 (eight) hours as needed for nausea or vomiting. 20 tablet Marjory Meints, [place] C, PA-C     PDMP not reviewed this encounter.   Sharyon Cable [place] C, PA-C 12/23/19 1747    Lew Dawes, PA-C 12/23/19 1748

## 2019-12-23 NOTE — ED Triage Notes (Signed)
Pt was hit in the left side of head/face with an alumunium/metal pipe after it rolled off the shelf above him; pt states he was knocked down but no LOC.  Pt states he has throbbing pain on left side of head & face along with a lot of dizziness.
